# Patient Record
Sex: Female | Born: 1970 | Race: White | Hispanic: No | Marital: Married | Smoking: Never smoker
Health system: Southern US, Community
[De-identification: ages and names within clinical notes are randomized; demographics above are authoritative.]

## PROBLEM LIST (undated history)

## (undated) DIAGNOSIS — D496 Neoplasm of unspecified behavior of brain: Secondary | ICD-10-CM

## (undated) DIAGNOSIS — E232 Diabetes insipidus: Secondary | ICD-10-CM

## (undated) DIAGNOSIS — E079 Disorder of thyroid, unspecified: Secondary | ICD-10-CM

## (undated) HISTORY — PX: SPLENECTOMY, TOTAL: SHX788

## (undated) HISTORY — PX: OTHER SURGICAL HISTORY: SHX169

---

## 2012-05-26 ENCOUNTER — Encounter (HOSPITAL_BASED_OUTPATIENT_CLINIC_OR_DEPARTMENT_OTHER): Payer: Self-pay

## 2012-05-26 ENCOUNTER — Emergency Department (HOSPITAL_BASED_OUTPATIENT_CLINIC_OR_DEPARTMENT_OTHER)
Admission: EM | Admit: 2012-05-26 | Discharge: 2012-05-26 | Disposition: A | Discharge status: Home / Self Care | Payer: BC Managed Care – PPO | Attending: Emergency Medicine | Admitting: Emergency Medicine

## 2012-05-26 DIAGNOSIS — R109 Unspecified abdominal pain: Secondary | ICD-10-CM

## 2012-05-26 DIAGNOSIS — R509 Fever, unspecified: Secondary | ICD-10-CM | POA: Insufficient documentation

## 2012-05-26 DIAGNOSIS — E079 Disorder of thyroid, unspecified: Secondary | ICD-10-CM | POA: Insufficient documentation

## 2012-05-26 DIAGNOSIS — R1084 Generalized abdominal pain: Secondary | ICD-10-CM | POA: Insufficient documentation

## 2012-05-26 DIAGNOSIS — Z79899 Other long term (current) drug therapy: Secondary | ICD-10-CM | POA: Insufficient documentation

## 2012-05-26 DIAGNOSIS — R110 Nausea: Secondary | ICD-10-CM | POA: Insufficient documentation

## 2012-05-26 DIAGNOSIS — E119 Type 2 diabetes mellitus without complications: Secondary | ICD-10-CM | POA: Insufficient documentation

## 2012-05-26 HISTORY — DX: Neoplasm of unspecified behavior of brain: D49.6

## 2012-05-26 HISTORY — DX: Disorder of thyroid, unspecified: E07.9

## 2012-05-26 HISTORY — DX: Diabetes insipidus: E23.2

## 2012-05-26 LAB — COMPREHENSIVE METABOLIC PANEL
ALT: 16 U/L (ref 0–35)
CO2: 23 mEq/L (ref 19–32)
Calcium: 8.7 mg/dL (ref 8.4–10.5)
Creatinine, Ser: 0.8 mg/dL (ref 0.50–1.10)
GFR calc Af Amer: >90 mL/min (ref >90)
GFR calc non Af Amer: >90 mL/min (ref >90)
Glucose, Bld: 103 mg/dL — ABNORMAL HIGH (ref 70–99)
Sodium: 139 mEq/L (ref 135–145)

## 2012-05-26 LAB — URINALYSIS, ROUTINE W REFLEX MICROSCOPIC
Bilirubin Urine: NEGATIVE
Nitrite: NEGATIVE
Specific Gravity, Urine: 1.008 (ref 1.005–1.030)
Urobilinogen, UA: 0.2 mg/dL (ref 0.0–1.0)

## 2012-05-26 LAB — CBC WITH DIFFERENTIAL/PLATELET
Eosinophils Relative: 1 % (ref 0–5)
HCT: 34.9 % — ABNORMAL LOW (ref 36.0–46.0)
Hemoglobin: 11.9 g/dL — ABNORMAL LOW (ref 12.0–15.0)
Lymphocytes Relative: 11 % — ABNORMAL LOW (ref 12–46)
Lymphs Abs: 0.5 10*3/uL — ABNORMAL LOW (ref 0.7–4.0)
MCV: 89 fL (ref 78.0–100.0)
Monocytes Absolute: 0.2 10*3/uL (ref 0.1–1.0)
Monocytes Relative: 5 % (ref 3–12)
RBC: 3.92 MIL/uL (ref 3.87–5.11)
WBC: 4.8 10*3/uL (ref 4.0–10.5)

## 2012-05-26 NOTE — ED Notes (Signed)
Pt reports nausea, diarrhea, fever and generalized body aches x 2 days.

## 2012-05-26 NOTE — ED Notes (Signed)
MD at bedside. 

## 2012-05-26 NOTE — ED Provider Notes (Signed)
History     CSN: [id number]  Arrival date & time 05/26/12  1700   First MD Initiated Contact with Patient 05/26/12 2027      Chief Complaint  Patient presents with  . Fever  . Diarrhea  . Generalized Body Aches    (Consider location/radiation/quality/duration/timing/severity/associated sxs/prior treatment) HPI The patient presents with concerns of ongoing nausea, diarrhea, abdominal pain, generalized body discomfort.  Symptoms began approximately 3 days ago, subtly.  Since onset symptoms have been persistent.  There's been little improvement with Motrin.  With concerns of dehydration the patient is also taking desmopressin, seemingly without any change in her condition. She denies any dyspnea, vomiting, confusion or disorientation. The patient was at [facility] prior to the onset of symptoms. Past Medical History  Diagnosis Date  . Diabetes insipidus   . Thyroid disease   . Brain tumor     Past Surgical History  Procedure Date  . Brain tumor removed     No family history on file.  History  Substance Use Topics  . Smoking status: Never Smoker   . Smokeless tobacco: Not on file  . Alcohol Use: No    OB History    Grav Para Term Preterm Abortions TAB SAB Ect Mult Living                  Review of Systems  Constitutional:       Per HPI, otherwise negative  HENT:       Per HPI, otherwise negative  Eyes: Negative.   Respiratory:       Per HPI, otherwise negative  Cardiovascular:       Per HPI, otherwise negative  Gastrointestinal: Positive for nausea and diarrhea. Negative for vomiting.  Genitourinary: Negative for dysuria.  Musculoskeletal:       Per HPI, otherwise negative  Skin: Negative.   Neurological: Negative for syncope.    Allergies  Shellfish allergy and Penicillins  Home Medications   Current Outpatient Rx  Name  Route  Sig  Dispense  Refill  . DESMOPRESSIN ACETATE 0.1 MG PO TABS   Oral   Take 0.1 mg by mouth daily.         Marland Kitchen  LEVOTHYROXINE SODIUM 75 MCG PO TABS   Oral   Take 75 mcg by mouth daily.           BP 115/75  Pulse 70  Temp 100.2 F (37.9 C) (Oral)  Resp 16  Ht 5\' 4"  (1.626 m)  Wt 130 lb (58.968 kg)  BMI 22.31 kg/m2  SpO2 99%  LMP 05/20/2012  Physical Exam  Nursing note and vitals reviewed. Constitutional: She is oriented to person, place, and time. She appears well-developed and well-nourished. No distress.  HENT:  Head: Normocephalic and atraumatic.  Eyes: Conjunctivae normal and EOM are normal.  Cardiovascular: Normal rate and regular rhythm.   Pulmonary/Chest: Effort normal and breath sounds normal. No stridor. No respiratory distress.  Abdominal: She exhibits no distension.       Generalized discomfort with no focal tenderness, no guarding, no rebound, no hepatosplenomegaly  Musculoskeletal: She exhibits no edema.  Neurological: She is alert and oriented to person, place, and time. No cranial nerve deficit.  Skin: Skin is warm and dry.  Psychiatric: She has a normal mood and affect.    ED Course  Procedures (including critical care time)  Labs Reviewed  CBC WITH DIFFERENTIAL - Abnormal; Notable for the following:    Hemoglobin 11.9 (*)  HCT 34.9 (*)     Neutrophils Relative 83 (*)     Lymphocytes Relative 11 (*)     Lymphs Abs 0.5 (*)     All other components within normal limits  COMPREHENSIVE METABOLIC PANEL - Abnormal; Notable for the following:    Glucose, Bld 103 (*)     Total Bilirubin 0.2 (*)     All other components within normal limits  URINALYSIS, ROUTINE W REFLEX MICROSCOPIC   No results found.   1. Abdominal pain       MDM  This generally well-appearing female presents with concerns of ongoing diarrhea, nausea, generalized discomfort.  On exam the patient is in no distress, speaking clearly, with unremarkable vital signs.  She does have a slight fever.  Given the patient's description of symptoms or suspicion of GI etiology, M.D. diffuse, minimal  abdominal discomfort corresponds to this.  Absent distress, abnormal vital signs, and with unremarkable labs, the patient is appropriate for discharge with outpatient management.  I discussed all findings with patient and her husband.   Gerhard Munch, MD 05/26/12 2051

## 2021-02-06 ENCOUNTER — Emergency Department (HOSPITAL_BASED_OUTPATIENT_CLINIC_OR_DEPARTMENT_OTHER): Payer: PRIVATE HEALTH INSURANCE

## 2021-02-06 ENCOUNTER — Encounter (HOSPITAL_BASED_OUTPATIENT_CLINIC_OR_DEPARTMENT_OTHER): Payer: Self-pay | Admitting: Emergency Medicine

## 2021-02-06 ENCOUNTER — Emergency Department (HOSPITAL_BASED_OUTPATIENT_CLINIC_OR_DEPARTMENT_OTHER)
Admission: EM | Admit: 2021-02-06 | Discharge: 2021-02-06 | Disposition: A | Discharge status: Home / Self Care | Payer: PRIVATE HEALTH INSURANCE | Attending: Emergency Medicine | Admitting: Emergency Medicine

## 2021-02-06 DIAGNOSIS — M542 Cervicalgia: Secondary | ICD-10-CM | POA: Insufficient documentation

## 2021-02-06 DIAGNOSIS — S0990XA Unspecified injury of head, initial encounter: Secondary | ICD-10-CM | POA: Diagnosis present

## 2021-02-06 DIAGNOSIS — Y93K1 Activity, walking an animal: Secondary | ICD-10-CM | POA: Diagnosis not present

## 2021-02-06 DIAGNOSIS — S060X0A Concussion without loss of consciousness, initial encounter: Secondary | ICD-10-CM | POA: Diagnosis not present

## 2021-02-06 DIAGNOSIS — W01198A Fall on same level from slipping, tripping and stumbling with subsequent striking against other object, initial encounter: Secondary | ICD-10-CM | POA: Insufficient documentation

## 2021-02-06 NOTE — ED Triage Notes (Signed)
Pt states was walking dog and fell and hit back of head on concrete/asphalt. Has HX of brain tumor removal 14 years ago. Denies LOC has blurry vision and nausea.

## 2021-02-06 NOTE — ED Provider Notes (Signed)
[facility] Provider Note   CSN: [id number] Arrival date & time: 02/06/21  2035     History Chief Complaint  Patient presents with   Head Injury    Sherri Mitchell is a 50 y.o. female.  The history is provided by the patient and medical records. No language interpreter was used.  Head Injury Location:  Occipital Time since incident:  2 hours Mechanism of injury: fall   Fall:    Fall occurred:  Standing   Impact surface:  Product manager of impact:  Head   Entrapped after fall: no   Pain details:    Quality:  Dull and aching   Severity:  Moderate   Timing:  Constant   Progression:  Improving Chronicity:  New Relieved by:  Nothing Worsened by:  Nothing Ineffective treatments:  None tried Associated symptoms: blurred vision, headache, nausea and neck pain   Associated symptoms: no disorientation, no double vision, no focal weakness, no loss of consciousness, no numbness, no seizures, no tinnitus and no vomiting       Past Medical History:  Diagnosis Date   Brain tumor ([place])    Diabetes insipidus ([place])    Thyroid disease     There are no problems to display for this patient.   Past Surgical History:  Procedure Laterality Date   brain tumor removed     SPLENECTOMY, TOTAL       OB History   No obstetric history on file.     History reviewed. No pertinent family history.  Social History   Tobacco Use   Smoking status: Never   Smokeless tobacco: Never  Vaping Use   Vaping Use: Never used  Substance Use Topics   Alcohol use: No   Drug use: No    Home Medications Prior to Admission medications   Medication Sig Start Date End Date Taking? Authorizing Provider  desmopressin (DDAVP) 0.1 MG tablet Take 0.1 mg by mouth daily.    [provider]  levothyroxine (SYNTHROID, LEVOTHROID) 75 MCG tablet Take 75 mcg by mouth daily.    [provider]    Allergies    Shellfish allergy and Penicillins  Review  of Systems   Review of Systems  Constitutional:  Negative for chills, diaphoresis, fatigue and fever.  HENT:  Negative for congestion and tinnitus.   Eyes:  Positive for blurred vision. Negative for double vision, photophobia and visual disturbance (resolved).  Respiratory:  Negative for cough, chest tightness and shortness of breath.   Cardiovascular:  Negative for chest pain.  Gastrointestinal:  Positive for nausea. Negative for abdominal pain, constipation, diarrhea and vomiting.  Genitourinary:  Negative for dysuria and frequency.  Musculoskeletal:  Positive for neck pain. Negative for back pain and neck stiffness.  Skin:  Negative for rash and wound.  Neurological:  Positive for headaches. Negative for dizziness, focal weakness, seizures, loss of consciousness, syncope, facial asymmetry, speech difficulty, weakness, light-headedness and numbness.  Psychiatric/Behavioral:  Negative for agitation.   All other systems reviewed and are negative.  Physical Exam Updated Vital Signs BP (!) 150/74 (BP Location: Right Arm)   Pulse 71   Temp 98.7 F (37.1 C) (Oral)   Resp 20   Ht '5\' 4"'$  (1.626 m)   Wt 59 kg   LMP 01/07/2021 (Approximate)   SpO2 99%   BMI 22.31 kg/m   Physical Exam Vitals and nursing note reviewed.  Constitutional:      General: She is not in acute  distress.    Appearance: She is well-developed. She is not ill-appearing, toxic-appearing or diaphoretic.  HENT:     Head: Normocephalic and atraumatic.      Comments:  Posterior occipital tenderness with no crepitance or fluctuance.  No laceration seen.      Nose: Nose normal.     Mouth/Throat:     Mouth: Mucous membranes are moist.  Eyes:     Extraocular Movements: Extraocular movements intact.     Conjunctiva/sclera: Conjunctivae normal.     Pupils: Pupils are equal, round, and reactive to light.  Cardiovascular:     Rate and Rhythm: Normal rate and regular rhythm.     Pulses: Normal pulses.     Heart sounds:  No murmur heard. Pulmonary:     Effort: Pulmonary effort is normal. No respiratory distress.     Breath sounds: Normal breath sounds. No wheezing, rhonchi or rales.  Chest:     Chest wall: No tenderness.  Abdominal:     General: Abdomen is flat.     Palpations: Abdomen is soft.     Tenderness: There is no abdominal tenderness. There is no right CVA tenderness, left CVA tenderness, guarding or rebound.  Musculoskeletal:        General: Tenderness present.     Cervical back: Neck supple. No tenderness.     Right lower leg: No edema.     Left lower leg: No edema.  Skin:    General: Skin is warm and dry.     Findings: No erythema.  Neurological:     General: No focal deficit present.     Mental Status: She is alert.     Sensory: No sensory deficit.     Motor: No weakness.  Psychiatric:        Mood and Affect: Mood normal.    ED Results / Procedures / Treatments   Labs (all labs ordered are listed, but only abnormal results are displayed) Labs Reviewed - No data to display  EKG None  Radiology CT HEAD WO CONTRAST (5MM)  Result Date: 02/06/2021 CLINICAL DATA:  Fall with neck pain hit back of head history of brain tumor EXAM: CT HEAD WITHOUT CONTRAST CT CERVICAL SPINE WITHOUT CONTRAST TECHNIQUE: Multidetector CT imaging of the head and cervical spine was performed following the standard protocol without intravenous contrast. Multiplanar CT image reconstructions of the cervical spine were also generated. COMPARISON:  None. FINDINGS: CT HEAD FINDINGS Brain: No acute territorial infarction, hemorrhage or intracranial mass. The ventricles are nonenlarged Vascular: No hyperdense vessel or unexpected calcification. Skull: Right frontotemporal craniotomy.  No fracture Sinuses/Orbits: No acute finding. Other: Small posterior scalp soft tissue swelling CT CERVICAL SPINE FINDINGS Alignment: No subluxation.  Facet alignment within normal Skull base and vertebrae: No acute fracture. No primary  bone lesion or focal pathologic process. Soft tissues and spinal canal: No prevertebral fluid or swelling. No visible canal hematoma. Disc levels: No significant disc space narrowing. Small central disc protrusion at C3-C4. Foramen are grossly patent Upper chest: Negative. Other: None IMPRESSION: 1. Right frontotemporal craniotomy. No CT evidence for acute intracranial abnormality. 2. No acute osseous abnormality of the cervical spine Electronically Signed   By: Donavan Foil M.D.   On: 02/06/2021 21:33   CT CERVICAL SPINE WO CONTRAST  Result Date: 02/06/2021 CLINICAL DATA:  Fall with neck pain hit back of head history of brain tumor EXAM: CT HEAD WITHOUT CONTRAST CT CERVICAL SPINE WITHOUT CONTRAST TECHNIQUE: Multidetector CT imaging of the head and cervical  spine was performed following the standard protocol without intravenous contrast. Multiplanar CT image reconstructions of the cervical spine were also generated. COMPARISON:  None. FINDINGS: CT HEAD FINDINGS Brain: No acute territorial infarction, hemorrhage or intracranial mass. The ventricles are nonenlarged Vascular: No hyperdense vessel or unexpected calcification. Skull: Right frontotemporal craniotomy.  No fracture Sinuses/Orbits: No acute finding. Other: Small posterior scalp soft tissue swelling CT CERVICAL SPINE FINDINGS Alignment: No subluxation.  Facet alignment within normal Skull base and vertebrae: No acute fracture. No primary bone lesion or focal pathologic process. Soft tissues and spinal canal: No prevertebral fluid or swelling. No visible canal hematoma. Disc levels: No significant disc space narrowing. Small central disc protrusion at C3-C4. Foramen are grossly patent Upper chest: Negative. Other: None IMPRESSION: 1. Right frontotemporal craniotomy. No CT evidence for acute intracranial abnormality. 2. No acute osseous abnormality of the cervical spine Electronically Signed   By: Donavan Foil M.D.   On: 02/06/2021 21:33     Procedures Procedures   Medications Ordered in ED Medications - No data to display  ED Course  I have reviewed the triage vital signs and the nursing notes.  Pertinent labs & imaging results that were available during my care of the patient were reviewed by me and considered in my medical decision making (see chart for details).    MDM Rules/Calculators/A&P                           Sherri Mitchell is a 50 y.o. female with a past medical history significant for previous brain tumor status post craniotomy and removal who presents with head injury.  Patient reports she was walking her 85 pounds dog when it jerked her to go chase another dog and pulled her to the ground.  She reports she hit the back of her occiput on the pavement but did not lose consciousness.  She reports immediate onset of pain, but did feel dazed, some nausea, and some blurry vision.  She reports the headaches have been persistent but slightly improving as had the blurry vision and nausea.  She reports no focal neurologic deficits otherwise with no numbness, tingling, or weakness of extremities.  No difficulty with speech or double vision.  She reported some pain at  The occiput and the upper neck but denies other injuries.  She was able to ambulate safely to come to the emergency department.  On exam, patient has tenderness in the occiput but no crepitance or laceration seen.  No neck tenderness on my exam but she reportedly has  .  No focal neurologic deficits otherwise.  Craniotomy scar seen on the right temple area.  Pupil symmetric reactive normal extract movements.  Clear speech and symmetric smile.  Normal finger-nose-finger testing bilaterally.  Patient otherwise well-appearing with nontender chest or abdomen and a clear breath sounds.  Patient had a CT of the head and neck which was showed her previous surgical changes but no evidence of acute intracranial hemorrhage.  Suspect she has a concussion primarily in a  postconcussive syndrome.  We offered patient headache cocktail which she would rather go home and use home medications and rest.  Will follow-up with PCP and understood return precautions and was discharged in good condition after reassuring imaging and reassessment.    Final Clinical Impression(s) / ED Diagnoses Final diagnoses:  Injury of head, initial encounter  Concussion without loss of consciousness, initial encounter    Rx / DC Orders ED Discharge  Orders     None      Clinical Impression: 1. Injury of head, initial encounter   2. Concussion without loss of consciousness, initial encounter     Disposition: Discharge  Condition: Good  I have discussed the results, Dx and Tx plan with the pt(& family if present). He/she/they expressed understanding and agree(s) with the plan. Discharge instructions discussed at great length. Strict return precautions discussed and pt &/or family have verbalized understanding of the instructions. No further questions at time of discharge.    Discharge Medication List as of 02/06/2021 10:50 PM      Follow Up: Tawni Pummel, [place] [address] [phone]     [facility] [address] [id number] [place] [phone]       Denna Fryberger, Gwenyth Allegra, MD 02/07/21 [phone]

## 2021-02-06 NOTE — Discharge Instructions (Signed)
Your history, exam, work-up today are consistent with a likely concussion from the head injury.  We did not see evidence of laceration or significant skin injury that would need repair and the CT scan did not show evidence of intracranial injury.  Clinically we suspect you do have a concussion so please rest and stay hydrated and follow-up with your primary doctor.  If any symptoms change or worsen, please return to the nearest emergency department.    Thank your patience getting seen here this evening.

## 2022-07-02 IMAGING — CT CT CERVICAL SPINE W/O CM
3 of 4 series · 12 of 33 positions shown, 14 images · non-contrast
Comparison: None.

CLINICAL DATA: Fall with neck pain hit back of head history of
brain tumor

EXAM:
CT HEAD WITHOUT CONTRAST
CT CERVICAL SPINE WITHOUT CONTRAST
TECHNIQUE: Multidetector CT imaging of the head and cervical spine was
performed following the standard protocol without intravenous
contrast. Multiplanar CT image reconstructions of the cervical spine
were also generated.

[Series 3: c_spine 2.0 i30s 3 · axial · 0.33mm/px · z∈[-238,-124]mm · 4 of 87 slices shown, 5 images]
[im 15/87  soft-tissue]
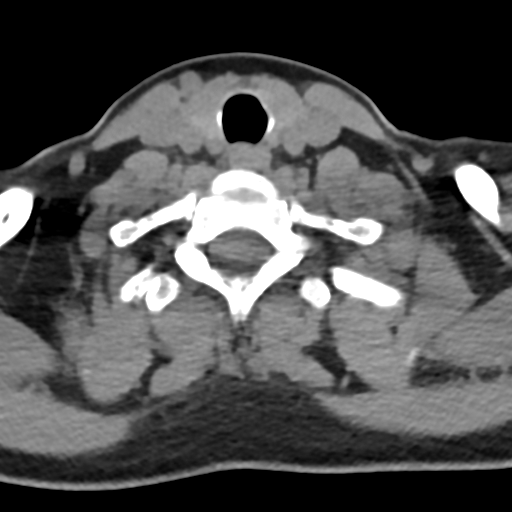
[im 15/87  bone]
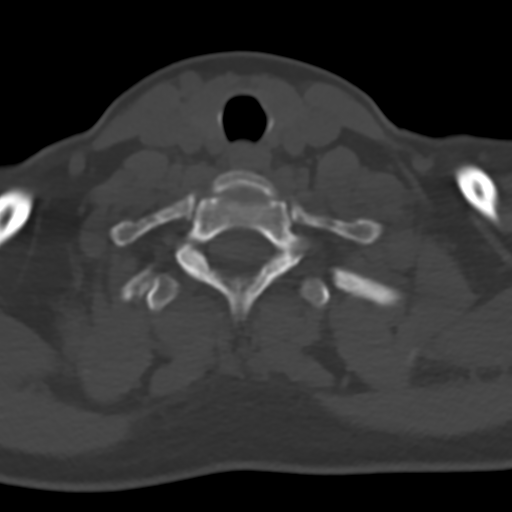
[im 29/87  bone]
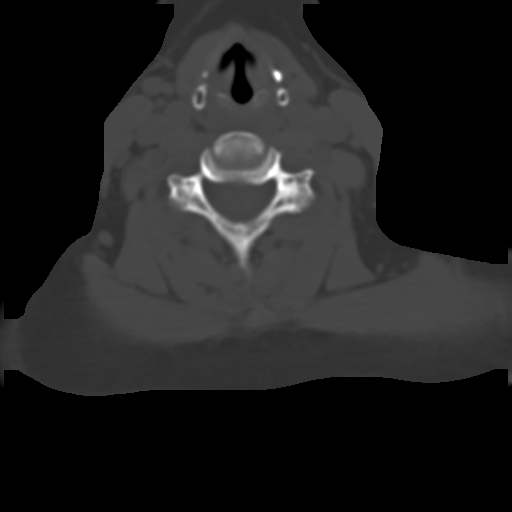
[im 58/87  bone]
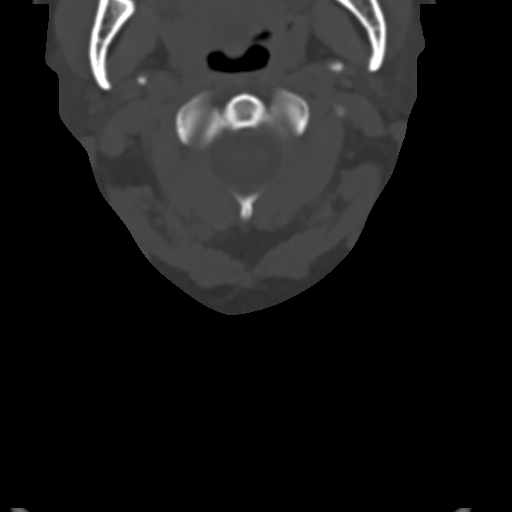
[im 72/87  bone]
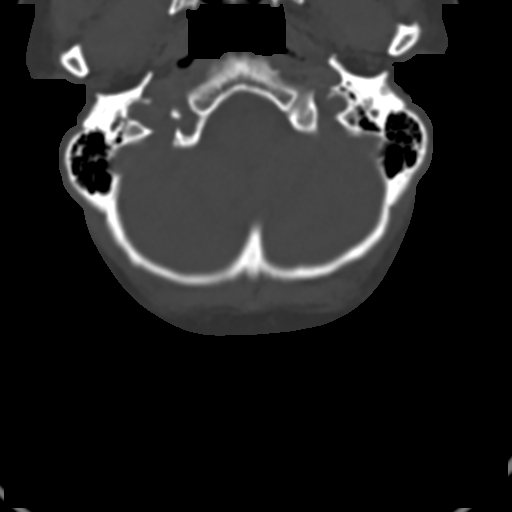

[Series 5: coronals · coronal · 0.25mm/px · 3 of 61 slices shown]
[im 13/61  bone]
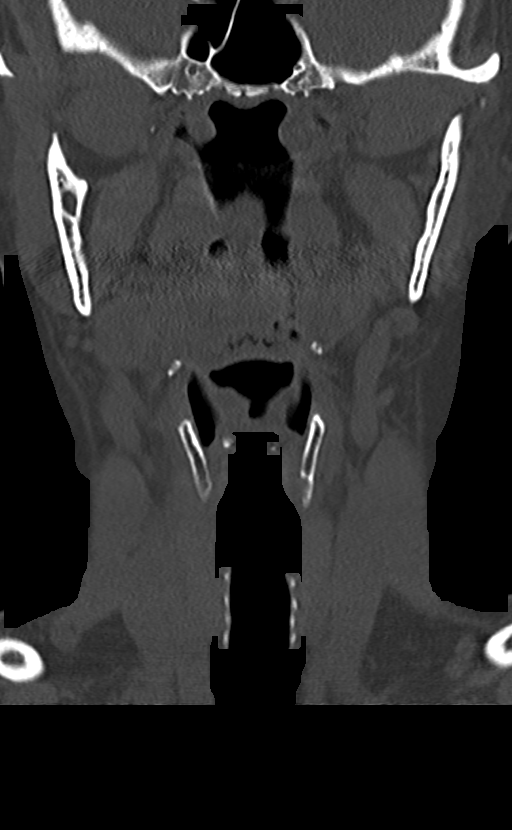
[im 25/61  bone]
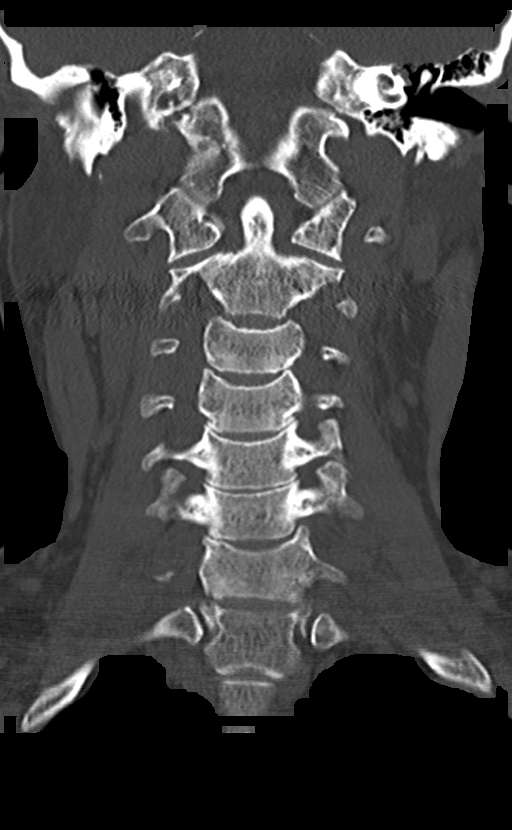
[im 37/61  bone]
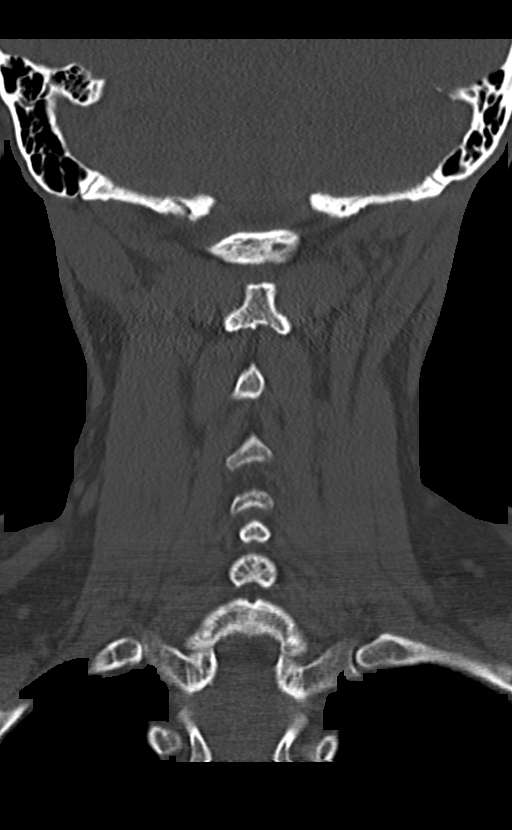

[Series 6: sagittals · sagittal · 0.25mm/px · 5 of 61 slices shown, 6 images]
[im 21/61  bone]
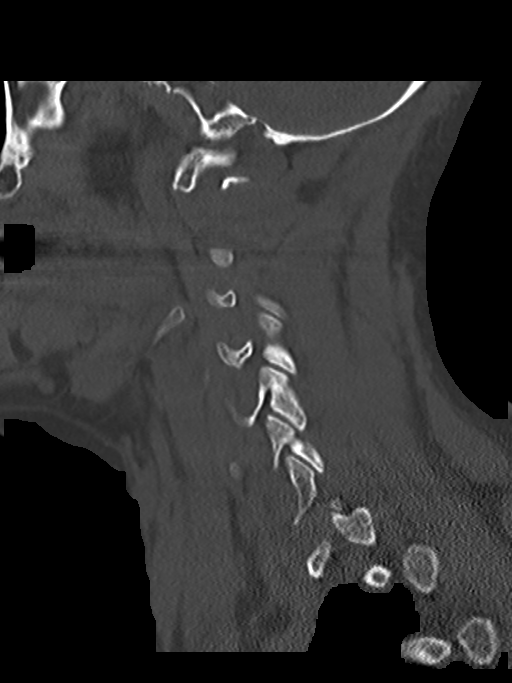
[im 26/61  bone]
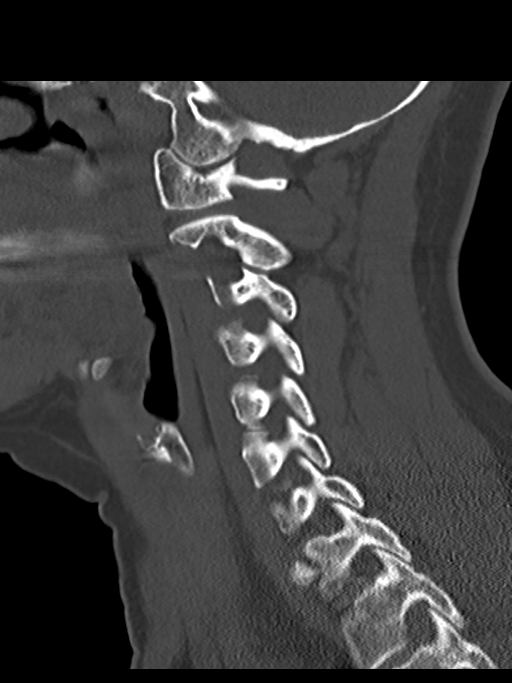
[im 31/61  soft-tissue]
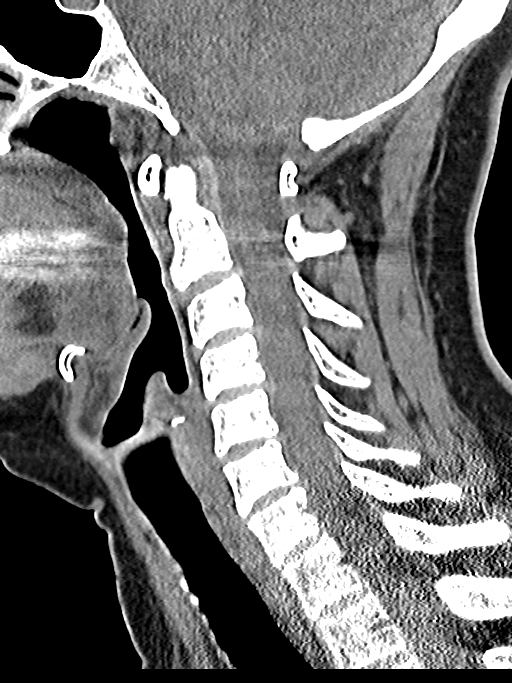
[im 31/61  bone]
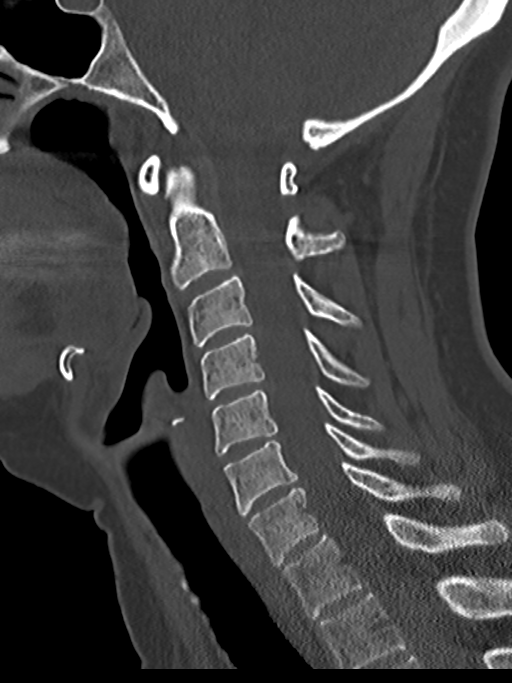
[im 36/61  bone]
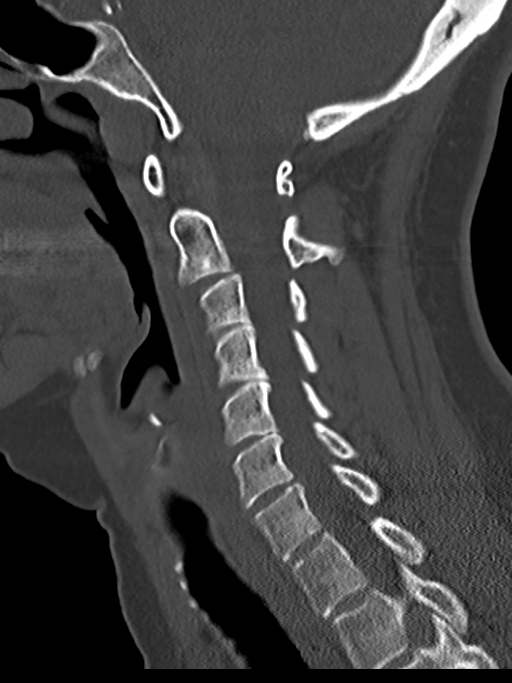
[im 41/61  bone]
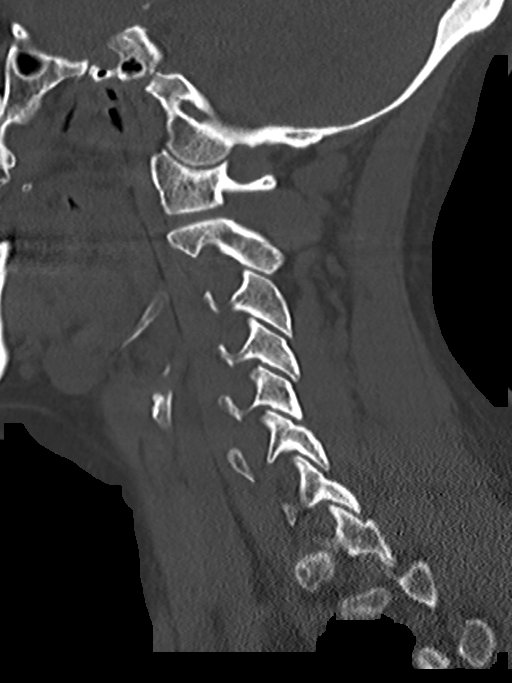

[12 of 33 positions shown; findings below may reference images not displayed]

FINDINGS: CT HEAD FINDINGS

Brain: No acute territorial infarction, hemorrhage or intracranial
mass. The ventricles are nonenlarged

Vascular: No hyperdense vessel or unexpected calcification.

Skull: Right frontotemporal craniotomy.  No fracture

Sinuses/Orbits: No acute finding.

Other: Small posterior scalp soft tissue swelling

CT CERVICAL SPINE FINDINGS

Alignment: No subluxation.  Facet alignment within normal

Skull base and vertebrae: No acute fracture. No primary bone lesion
or focal pathologic process.

Soft tissues and spinal canal: No prevertebral fluid or swelling. No
visible canal hematoma.

Disc levels: No significant disc space narrowing. Small central disc
protrusion at C3-C4. Foramen are grossly patent

Upper chest: Negative.

Other: None
IMPRESSION: 1. Right frontotemporal craniotomy. No CT evidence for acute
intracranial abnormality.
2. No acute osseous abnormality of the cervical spine

## 2022-07-02 IMAGING — CT CT HEAD W/O CM
3 of 4 series · 14 of 47 positions shown, 16 images · non-contrast
Comparison: None.

CLINICAL DATA: Fall with neck pain hit back of head history of
brain tumor

EXAM:
CT HEAD WITHOUT CONTRAST
CT CERVICAL SPINE WITHOUT CONTRAST
TECHNIQUE: Multidetector CT imaging of the head and cervical spine was
performed following the standard protocol without intravenous
contrast. Multiplanar CT image reconstructions of the cervical spine
were also generated.

[Series 3: head 2.0 h70h · axial · 0.48mm/px · z∈[-136,-2]mm · 8 of 85 slices shown, 10 images]
[im 9/85  brain]
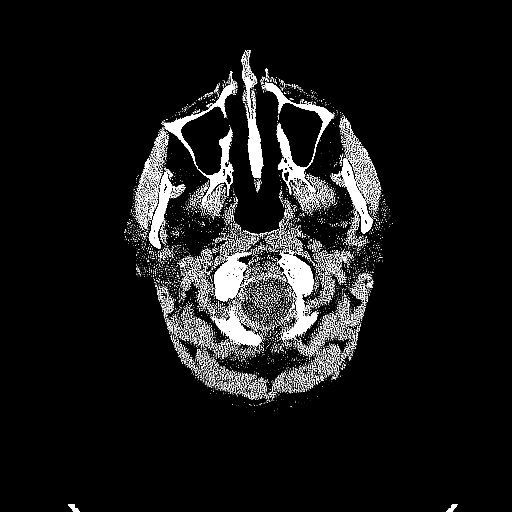
[im 9/85  bone]
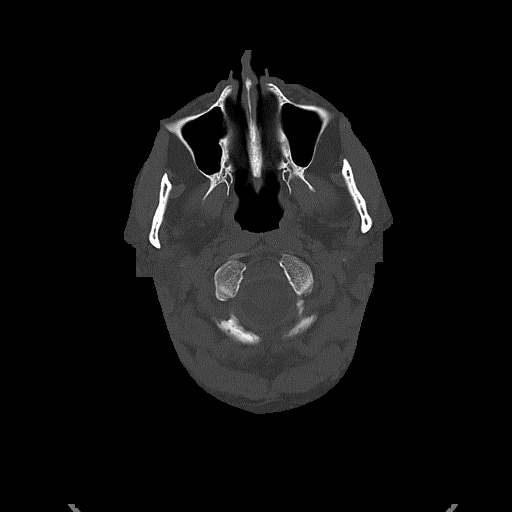
[im 17/85  brain]
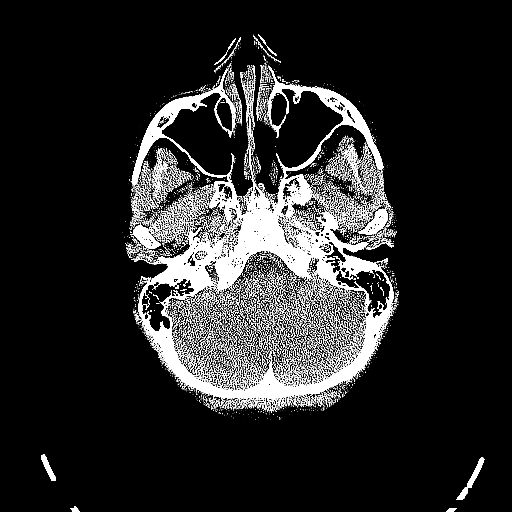
[im 26/85  brain]
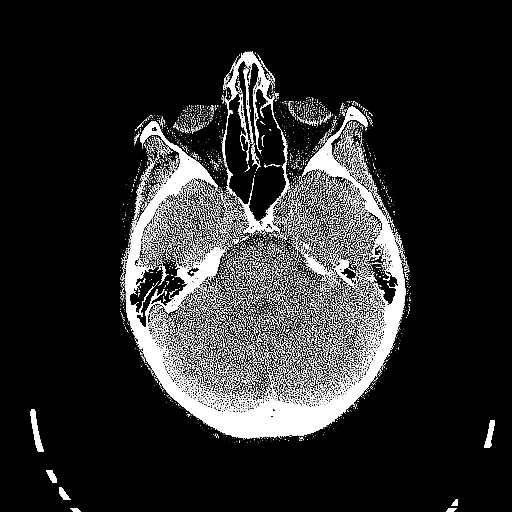
[im 38/85  brain]
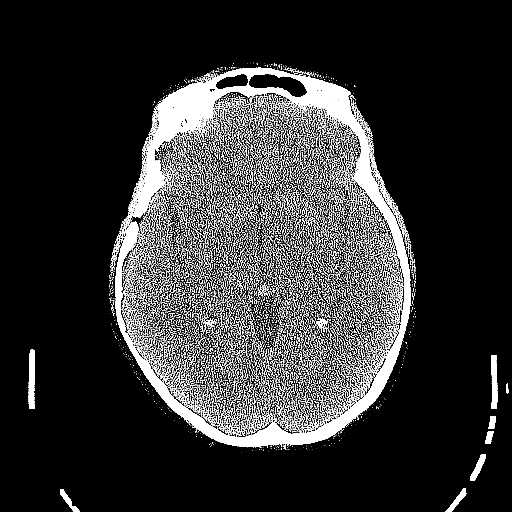
[im 47/85  brain]
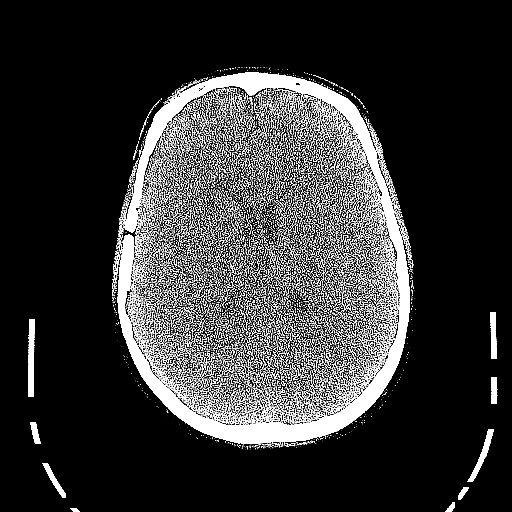
[im 47/85  bone]
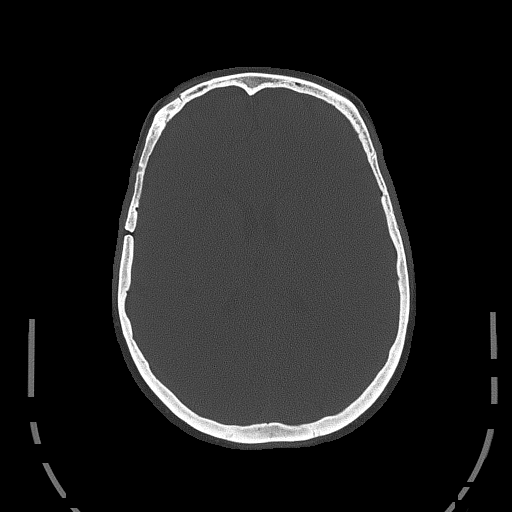
[im 59/85  brain]
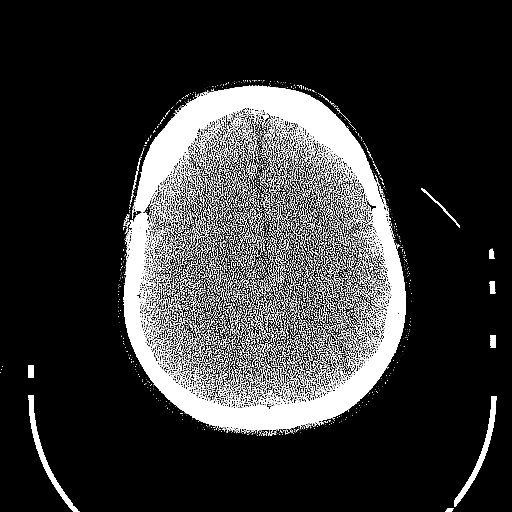
[im 68/85  brain]
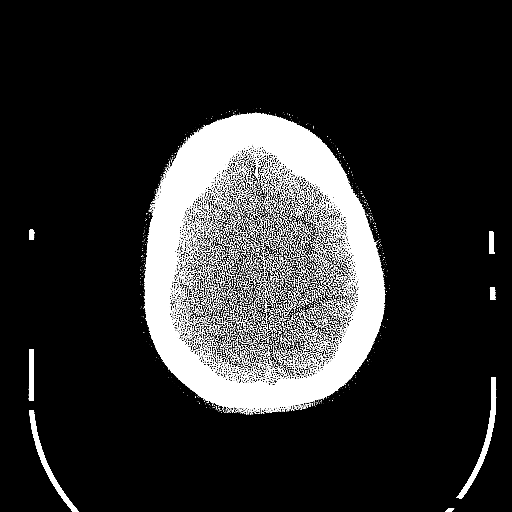
[im 76/85  brain]
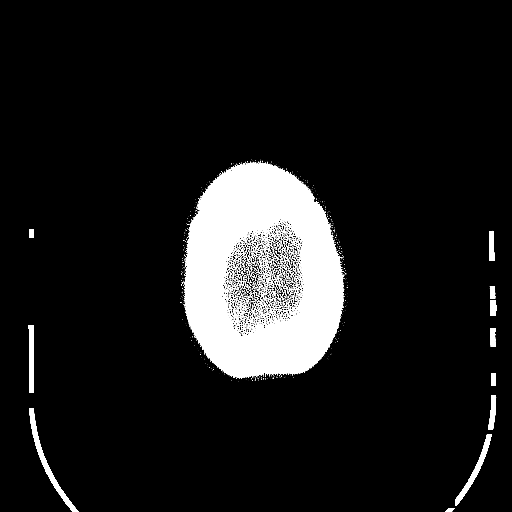

[Series 4: head 3.0 mpr cor · coronal · 0.33mm/px · 3 of 67 slices shown]
[im 23/67  brain]
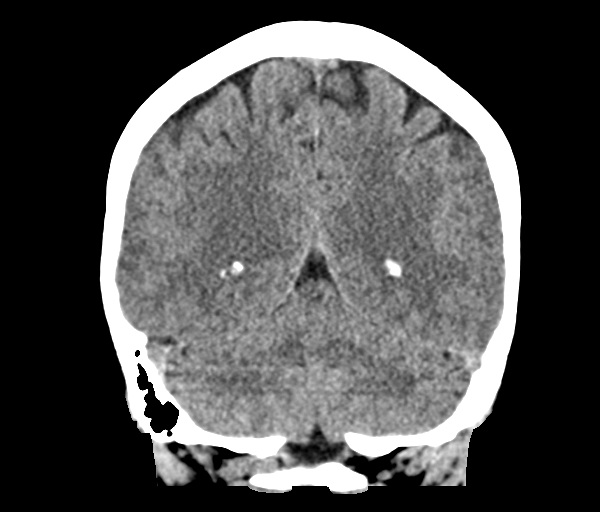
[im 30/67  brain]
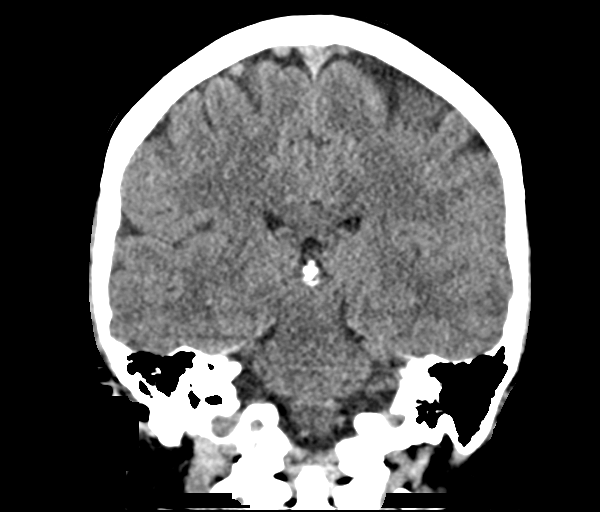
[im 37/67  brain]
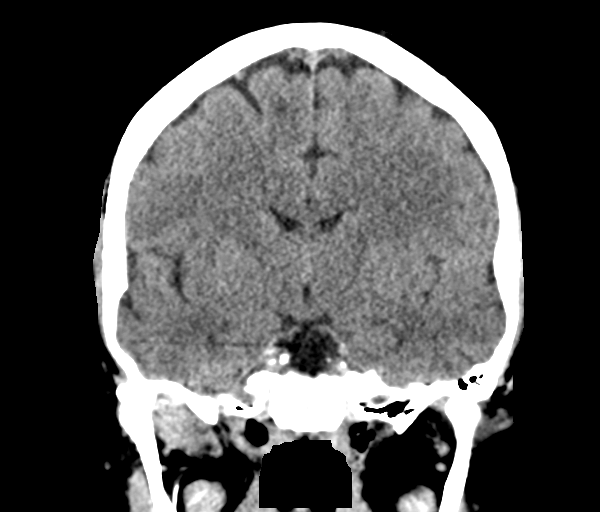

[Series 5: head 3.0 mpr sag · sagittal · 0.33mm/px · 3 of 66 slices shown]
[im 22/66  brain]
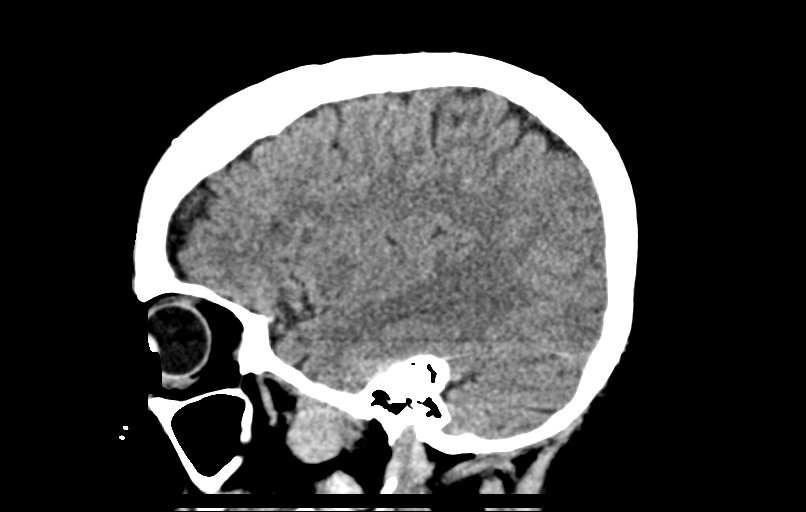
[im 33/66  brain]
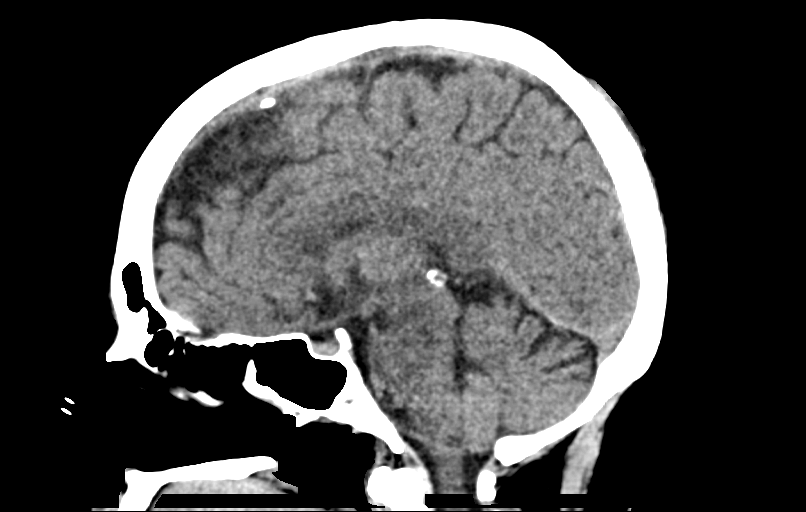
[im 44/66  brain]
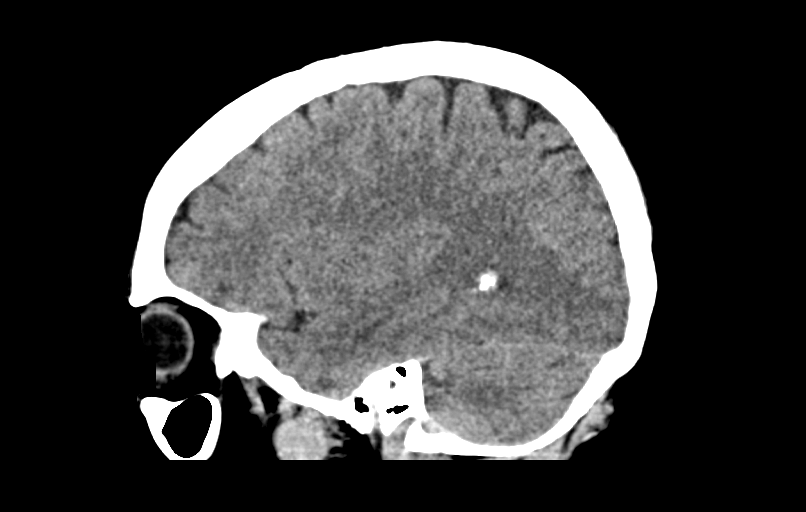

[14 of 47 positions shown; findings below may reference images not displayed]

FINDINGS: CT HEAD FINDINGS

Brain: No acute territorial infarction, hemorrhage or intracranial
mass. The ventricles are nonenlarged

Vascular: No hyperdense vessel or unexpected calcification.

Skull: Right frontotemporal craniotomy.  No fracture

Sinuses/Orbits: No acute finding.

Other: Small posterior scalp soft tissue swelling

CT CERVICAL SPINE FINDINGS

Alignment: No subluxation.  Facet alignment within normal

Skull base and vertebrae: No acute fracture. No primary bone lesion
or focal pathologic process.

Soft tissues and spinal canal: No prevertebral fluid or swelling. No
visible canal hematoma.

Disc levels: No significant disc space narrowing. Small central disc
protrusion at C3-C4. Foramen are grossly patent

Upper chest: Negative.

Other: None
IMPRESSION: 1. Right frontotemporal craniotomy. No CT evidence for acute
intracranial abnormality.
2. No acute osseous abnormality of the cervical spine
